# Patient Record
Sex: Female | Born: 1960 | Race: White | Hispanic: No | Marital: Married | Smoking: Former smoker
Health system: Southern US, Community
[De-identification: ages and names within clinical notes are randomized; demographics above are authoritative.]

---

## 1987-02-11 HISTORY — PX: TUBAL LIGATION: SHX77

## 2008-03-25 ENCOUNTER — Ambulatory Visit: Payer: Self-pay | Admitting: Occupational Medicine

## 2008-03-25 DIAGNOSIS — S63509A Unspecified sprain of unspecified wrist, initial encounter: Secondary | ICD-10-CM | POA: Insufficient documentation

## 2008-04-05 ENCOUNTER — Ambulatory Visit: Payer: Self-pay | Admitting: Family Medicine

## 2008-04-05 DIAGNOSIS — S60219A Contusion of unspecified wrist, initial encounter: Secondary | ICD-10-CM

## 2008-04-05 DIAGNOSIS — S60229A Contusion of unspecified hand, initial encounter: Secondary | ICD-10-CM | POA: Insufficient documentation

## 2009-05-25 IMAGING — CR DG WRIST 2V*L*
1 series · 1 of 1 positions shown · non-contrast
Comparison: 03/25/2008

CLINICAL DATA: Pain, fell 8-10 days ago

LEFT WRIST - 2 VIEW

[view not recorded]
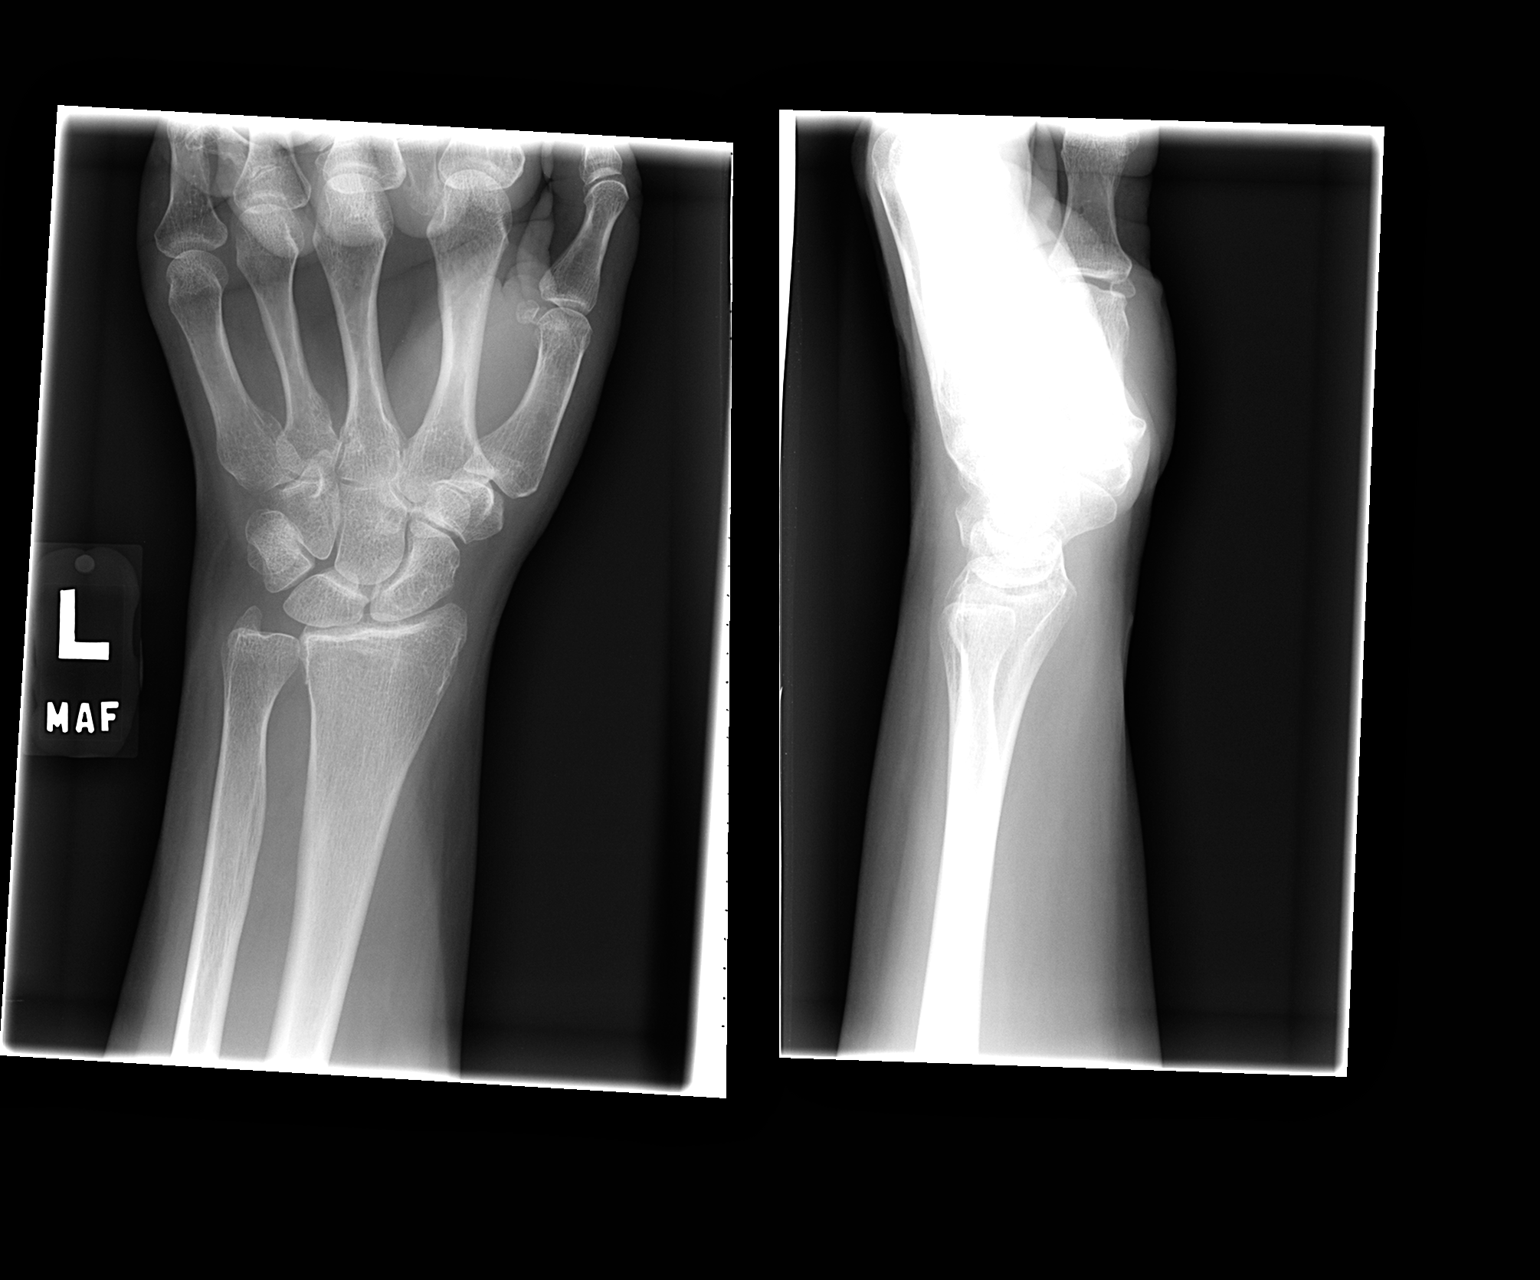

[1 of 1 positions shown; findings below may reference images not displayed]

FINDINGS: Mild bony demineralization.
No acute fracture, dislocation, or bone destruction.
Joint spaces preserved.
No definite soft tissue abnormalities.
IMPRESSION: No acute abnormalities.

## 2010-05-23 ENCOUNTER — Telehealth: Payer: Self-pay | Admitting: Family Medicine

## 2010-05-23 ENCOUNTER — Encounter: Payer: Self-pay | Admitting: Family Medicine

## 2010-05-23 ENCOUNTER — Ambulatory Visit
Admission: RE | Admit: 2010-05-23 | Discharge: 2010-05-23 | Disposition: A | Discharge status: Home / Self Care | Payer: 59 | Source: Ambulatory Visit | Attending: Family Medicine | Admitting: Family Medicine

## 2010-05-23 ENCOUNTER — Ambulatory Visit (INDEPENDENT_AMBULATORY_CARE_PROVIDER_SITE_OTHER): Payer: 59 | Admitting: Family Medicine

## 2010-05-23 VITALS — BP 133/88 | HR 108 | Ht 65.0 in | Wt 122.0 lb

## 2010-05-23 DIAGNOSIS — F419 Anxiety disorder, unspecified: Secondary | ICD-10-CM | POA: Insufficient documentation

## 2010-05-23 DIAGNOSIS — R0781 Pleurodynia: Secondary | ICD-10-CM

## 2010-05-23 DIAGNOSIS — F329 Major depressive disorder, single episode, unspecified: Secondary | ICD-10-CM

## 2010-05-23 DIAGNOSIS — F341 Dysthymic disorder: Secondary | ICD-10-CM

## 2010-05-23 DIAGNOSIS — R079 Chest pain, unspecified: Secondary | ICD-10-CM

## 2010-05-23 DIAGNOSIS — R634 Abnormal weight loss: Secondary | ICD-10-CM

## 2010-05-23 MED ORDER — CITALOPRAM HYDROBROMIDE 20 MG PO TABS: ORAL_TABLET | ORAL | Status: DC

## 2010-05-23 NOTE — Assessment & Plan Note (Addendum)
PHQ-9 score of 18, GAD-7 score of 17.  She clearly has depression and anxiety that is predominantly situational. We discussed the option of counseling I let her know we have 3 great counselors down stairs. They also discussed the option of medication including SSRIs. We discussed the benefits and potential risks with these medications including suicidal risks. She decided that she would like to start a medication. I will followup in 3 weeks. She says she will think about the counseling.

## 2010-05-23 NOTE — Telephone Encounter (Signed)
Call patient: No fracture of the ribs. Likely soft tissue injury should hopefully get better for the next 3-4 weeks.
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
## 2010-05-24 ENCOUNTER — Telehealth: Payer: Self-pay | Admitting: Family Medicine

## 2010-05-24 LAB — COMPLETE METABOLIC PANEL WITH GFR
AST: 14 U/L (ref 0–37)
Alkaline Phosphatase: 45 U/L (ref 39–117)
BUN: 9 mg/dL (ref 6–23)
Creat: 0.89 mg/dL (ref 0.40–1.20)
GFR, Est Non African American: >60 mL/min (ref >60)
Glucose, Bld: 90 mg/dL (ref 70–99)
Total Bilirubin: 0.4 mg/dL (ref 0.3–1.2)

## 2010-05-24 LAB — CBC
HCT: 45.8 % (ref 36.0–46.0)
Hemoglobin: 15.1 g/dL — ABNORMAL HIGH (ref 12.0–15.0)
MCH: 29.2 pg (ref 26.0–34.0)
MCHC: 33 g/dL (ref 30.0–36.0)
MCV: 88.6 fL (ref 78.0–100.0)
RDW: 15.2 % (ref 11.5–15.5)

## 2010-05-24 LAB — TSH: TSH: 1.23 u[IU]/mL (ref 0.350–4.500)

## 2010-05-24 NOTE — Telephone Encounter (Signed)
Call pt: Gina Macias, Gina Macias, Gina Macias. Hgb was elevated but that is usually from smoking. No anemia.

## 2010-05-24 NOTE — Telephone Encounter (Signed)
LMOM

## 2010-05-24 NOTE — Telephone Encounter (Signed)
Left results on vm. 

## 2010-06-20 ENCOUNTER — Ambulatory Visit (INDEPENDENT_AMBULATORY_CARE_PROVIDER_SITE_OTHER): Payer: 59 | Admitting: Family Medicine

## 2010-06-20 DIAGNOSIS — F341 Dysthymic disorder: Secondary | ICD-10-CM

## 2010-06-20 DIAGNOSIS — F329 Major depressive disorder, single episode, unspecified: Secondary | ICD-10-CM

## 2010-06-20 DIAGNOSIS — B079 Viral wart, unspecified: Secondary | ICD-10-CM

## 2010-06-20 DIAGNOSIS — G4700 Insomnia, unspecified: Secondary | ICD-10-CM

## 2010-06-20 MED ORDER — CITALOPRAM HYDROBROMIDE 20 MG PO TABS: 20.0000 mg | ORAL_TABLET | ORAL | Status: DC

## 2010-06-20 NOTE — Progress Notes (Signed)
  Subjective:    Patient ID: Gina Macias, female    DOB: March 20, 1960, 50 y.o.   MRN: [id number]  HPI Feels her mood is better but still not sleeping well. Only getting about 6 hours. Falling alseep Ok burt can't stay asleep. Goes to bed around 9:30-10:00 and usually wakes up 4 AM.  Sleeps is maybe some is better. Says has been doing well at work. No SE or sedation. No concerns.  She is happy with her current dose.    Review of Systems     Objective:   Physical Exam  Constitutional: She appears well-developed and well-nourished.  Cardiovascular: Normal rate, regular rhythm and normal heart sounds.   Pulmonary/Chest: Effort normal and breath sounds normal.  Psychiatric: She has a normal mood and affect. Her behavior is normal.          Assessment & Plan:  PHQ-9 score of 4 today.

## 2010-06-20 NOTE — Assessment & Plan Note (Signed)
PHQ- 9 score of 4 today which is at goal. Continue current dose and f/u in 6 weeks to see if doing even better and if sleep is improving.

## 2010-06-20 NOTE — Assessment & Plan Note (Signed)
Discussed that since her sleep is some better will continue her current regimen We did discuss the option of adding a sleep aid and she would like to hold off for now.

## 2010-06-21 NOTE — Progress Notes (Signed)
  Subjective:    Patient ID: Gina Macias, female    DOB: 1960/06/09, 50 y.o.   MRN: [id number]  HPI    Review of Systems     Objective:   Physical Exam       Also cryotherpay performed on the to of the right foot to a wart. This is her second freeze.  She does feel it looks some better.  Assessment & Plan:

## 2010-10-09 ENCOUNTER — Ambulatory Visit (INDEPENDENT_AMBULATORY_CARE_PROVIDER_SITE_OTHER): Payer: 59 | Admitting: Family Medicine

## 2010-10-09 VITALS — BP 116/72 | HR 76 | Wt 124.0 lb

## 2010-10-09 DIAGNOSIS — Z13220 Encounter for screening for lipoid disorders: Secondary | ICD-10-CM

## 2010-10-09 DIAGNOSIS — Z23 Encounter for immunization: Secondary | ICD-10-CM

## 2010-10-09 DIAGNOSIS — Z1211 Encounter for screening for malignant neoplasm of colon: Secondary | ICD-10-CM

## 2010-10-09 DIAGNOSIS — F329 Major depressive disorder, single episode, unspecified: Secondary | ICD-10-CM

## 2010-10-09 MED ORDER — CITALOPRAM HYDROBROMIDE 20 MG PO TABS: 20.0000 mg | ORAL_TABLET | ORAL | Status: DC

## 2010-10-09 NOTE — Progress Notes (Signed)
  Subjective:    Patient ID: Gina Macias, female    DOB: 1960-06-21, 50 y.o.   MRN: [id number]  HPI F/U depression. No problems or SE. Sleeping well.  She is has done really well. Has done some conseling. Work is really stressful but this has really been helping her.  Due for RF.      Review of Systems     Objective:   Physical Exam  Constitutional: She is oriented to person, place, and time. She appears well-developed and well-nourished.  HENT:  Head: Normocephalic and atraumatic.  Cardiovascular: Normal rate, regular rhythm and normal heart sounds.   Pulmonary/Chest: Effort normal and breath sounds normal.  Neurological: She is alert and oriented to person, place, and time.  Skin: Skin is warm and dry.  Psychiatric: She has a normal mood and affect.          Assessment & Plan:  Tdap given today. Flu vac declined.   Discussed need for colon cancer screening. Pt ok with referral to GI for colonoscopy.   Depression - Doing very well PHQ- 9 score of 0 today. Pt would like to continue med for now. I recommend being therapeutic fr 6-12 months to dec risk of relapse.  She has been on med for 4 months. F/U in Jan.   Insomnia - Resolved.

## 2010-12-03 ENCOUNTER — Encounter: Payer: Self-pay | Admitting: Family Medicine

## 2012-05-03 ENCOUNTER — Encounter: Payer: Self-pay | Admitting: Family Medicine

## 2015-11-13 ENCOUNTER — Ambulatory Visit (INDEPENDENT_AMBULATORY_CARE_PROVIDER_SITE_OTHER): Payer: BLUE CROSS/BLUE SHIELD | Admitting: Family Medicine

## 2015-11-13 ENCOUNTER — Encounter: Payer: Self-pay | Admitting: Family Medicine

## 2015-11-13 VITALS — BP 108/68 | HR 77 | Ht 65.0 in | Wt 184.0 lb

## 2015-11-13 DIAGNOSIS — M25472 Effusion, left ankle: Secondary | ICD-10-CM

## 2015-11-13 DIAGNOSIS — Z0000 Encounter for general adult medical examination without abnormal findings: Secondary | ICD-10-CM

## 2015-11-13 NOTE — Patient Instructions (Signed)
Keep up a regular exercise program and make sure you are eating a healthy diet Try to eat 4 servings of dairy a day, or if you are lactose intolerant take a calcium with vitamin D daily.  Your vaccines are up to date.   

## 2015-11-13 NOTE — Progress Notes (Signed)
Subjective:     Gina Macias is a 55 y.o. female and is here for a comprehensive physical exam. The patient reports no problems.  Social History   Social History  . Marital status: Married    Spouse name: Molly Maduro   . Number of children: 3  . 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 Smoking status: Former Smoker    Packs/day: 1.00    Types: Cigarettes    Quit date: 02/13/2015  . Smokeless tobacco: Never Used  . Alcohol use 0.6 - 1.2 oz/week    1 - 2 Cans of beer per week  . Drug use: No  . Sexual activity: Yes    Partners: Male    Birth control/ protection: None   Other Topics Concern  . Not on file   Social History Narrative   Her GYN is Dr. Unknown Foley at [facility]. She has 2 daughters that still live at home. One daughter is married and out of the home. She does exercise daily for about 20-30 minutes with walking and doing weights.   Health Maintenance  Topic Date Due  . Hepatitis C Screening  1961/02/03  . HIV Screening  07/01/1974  . INFLUENZA VACCINE  02/10/2016 (Originally 09/11/2015)  . MAMMOGRAM  02/10/2016 (Originally 04/09/2014)  . PAP SMEAR  02/10/2016 (Originally 03/30/2012)  . TETANUS/TDAP  10/08/2020  . COLONOSCOPY  11/27/2020    The following portions of the patient's history were reviewed and updated as appropriate: allergies, current medications, past family history, past medical history, past social history, past surgical history and problem list.  Review of Systems A comprehensive review of systems was negative.    BP 108/68   Pulse 77   Ht 5\' 5"  (1.651 m)   Wt 184 lb (83.5 kg)   SpO2 93%   BMI 30.62 kg/m     No Known Allergies  No past medical history on file.  Past Surgical History:  Procedure Laterality Date  . TUBAL LIGATION  1989    Social History   Social History  . Marital status: Married    Spouse name: Molly Maduro   . Number of  children: 3  . 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 Smoking status: Former Smoker    Packs/day: 1.00    Types: Cigarettes    Quit date: 02/13/2015  . Smokeless tobacco: Never Used  . Alcohol use 0.6 - 1.2 oz/week    1 - 2 Cans of beer per week  . Drug use: No  . Sexual activity: Yes    Partners: Male    Birth control/ protection: None   Other Topics Concern  . Not on file   Social History Narrative   Her GYN is Dr. Unknown Foley at [facility]. She has 2 daughters that still live at home. One daughter is married and out of the home. She does exercise daily for about 20-30 minutes with walking and doing weights.    Family History  Problem Relation Age of Onset  . Diabetes      cousin    Outpatient Encounter Prescriptions as of 11/13/2015  Medication Sig  . calcium carbonate 200 MG capsule Take 250 mg by mouth 2 (two) times daily with a meal.    . [DISCONTINUED] citalopram (CELEXA) 20 MG tablet Take 1 tablet (20  mg total) by mouth every morning.  . [DISCONTINUED] Multiple Vitamin (MULTIVITAMIN) tablet Take 1 tablet by mouth daily.    . [DISCONTINUED] Omega-3 Fatty Acids (FISH OIL) 1000 MG CAPS Take by mouth.     No facility-administered encounter medications on file as of 11/13/2015.        Objective:    BP 108/68   Pulse 77   Ht 5\' 5"  (1.651 m)   Wt 184 lb (83.5 kg)   SpO2 93%   BMI 30.62 kg/m  General appearance: alert, cooperative and appears stated age Head: Normocephalic, without obvious abnormality, atraumatic Eyes: conj clear, EOMI< PEERLA Ears: normal TM's and external ear canals both ears Nose: Nares normal. Septum midline. Mucosa normal. No drainage or sinus tenderness. Throat: lips, mucosa, and tongue normal; teeth and gums normal Neck: no adenopathy, no carotid bruit, no JVD, supple, symmetrical, trachea midline and thyroid not enlarged, symmetric, no  tenderness/mass/nodules Back: symmetric, no curvature. ROM normal. No CVA tenderness. Lungs: clear to auscultation bilaterally Heart: regular rate and rhythm, S1, S2 normal, no murmur, click, rub or gallop Abdomen: soft, non-tender; bowel sounds normal; no masses,  no organomegaly Extremities: trace edema right ankle. right ankle is normal Pulses: 2+ and symmetric Skin: Skin color, texture, turgor normal. No rashes or lesions Lymph nodes: Cervical, supraclavicular, and axillary nodes normal. Neurologic: Alert and oriented X 3, normal strength and tone. Normal symmetric reflexes. Normal coordination and gait    Assessment:    Healthy female exam.    Plan:     See After Visit Summary for Counseling Recommendations   Keep up a regular exercise program and make sure you are eating a healthy diet Try to eat 4 servings of dairy a day, or if you are lactose intolerant take a calcium with vitamin D daily.  Your vaccines are up to date.   Left ankle edema, trace-could just be from venous stasis. Will check thyroid and CBC as well as a regular blood work I gave reassurance. Certainly the gets worse please let me know. Could consider wearing compression stockings.

## 2015-11-20 LAB — CBC WITH DIFFERENTIAL/PLATELET
BASOS PCT: 0 %
Basophils Absolute: 0 cells/uL (ref 0–200)
EOS ABS: 284 {cells}/uL (ref 15–500)
Eosinophils Relative: 4 %
HCT: 41.6 % (ref 35.0–45.0)
Hemoglobin: 13.7 g/dL (ref 11.7–15.5)
LYMPHS PCT: 26 %
Lymphs Abs: 1846 cells/uL (ref 850–3900)
MCH: 28.3 pg (ref 27.0–33.0)
MCHC: 32.9 g/dL (ref 32.0–36.0)
MCV: 86 fL (ref 80.0–100.0)
MONOS PCT: 7 %
MPV: 9.6 fL (ref 7.5–12.5)
Monocytes Absolute: 497 cells/uL (ref 200–950)
NEUTROS PCT: 63 %
Neutro Abs: 4473 cells/uL (ref 1500–7800)
PLATELETS: 377 10*3/uL (ref 140–400)
RBC: 4.84 MIL/uL (ref 3.80–5.10)
RDW: 14.9 % (ref 11.0–15.0)
WBC: 7.1 10*3/uL (ref 3.8–10.8)

## 2015-11-21 LAB — LIPID PANEL
Cholesterol: 260 mg/dL — ABNORMAL HIGH (ref 125–200)
HDL: 48 mg/dL (ref >=46)
LDL Cholesterol: 179 mg/dL — ABNORMAL HIGH (ref <130)
Total CHOL/HDL Ratio: 5.4 Ratio — ABNORMAL HIGH (ref <=5.0)
Triglycerides: 165 mg/dL — ABNORMAL HIGH (ref <150)
VLDL: 33 mg/dL — ABNORMAL HIGH (ref <30)

## 2015-11-21 LAB — COMPLETE METABOLIC PANEL WITH GFR
ALT: 25 U/L (ref 6–29)
AST: 23 U/L (ref 10–35)
Albumin: 4.3 g/dL (ref 3.6–5.1)
Alkaline Phosphatase: 79 U/L (ref 33–130)
BUN: 12 mg/dL (ref 7–25)
CO2: 27 mmol/L (ref 20–31)
Calcium: 9.5 mg/dL (ref 8.6–10.4)
Chloride: 103 mmol/L (ref 98–110)
Creat: 0.85 mg/dL (ref 0.50–1.05)
GFR, EST NON AFRICAN AMERICAN: 77 mL/min (ref >=60)
GFR, Est African American: 89 mL/min (ref >=60)
GLUCOSE: 104 mg/dL — AB (ref 65–99)
POTASSIUM: 4.5 mmol/L (ref 3.5–5.3)
SODIUM: 138 mmol/L (ref 135–146)
TOTAL PROTEIN: 6.2 g/dL (ref 6.1–8.1)
Total Bilirubin: 0.5 mg/dL (ref 0.2–1.2)

## 2015-11-21 LAB — TSH: TSH: 1.59 mIU/L

## 2015-11-26 LAB — HM MAMMOGRAPHY

## 2015-12-06 ENCOUNTER — Encounter: Payer: Self-pay | Admitting: Family Medicine

## 2017-03-27 DIAGNOSIS — Z1231 Encounter for screening mammogram for malignant neoplasm of breast: Secondary | ICD-10-CM | POA: Diagnosis not present

## 2017-03-27 LAB — HM MAMMOGRAPHY

## 2017-04-03 ENCOUNTER — Encounter: Payer: Self-pay | Admitting: Family Medicine

## 2017-07-22 DIAGNOSIS — H04123 Dry eye syndrome of bilateral lacrimal glands: Secondary | ICD-10-CM | POA: Diagnosis not present

## 2018-03-29 DIAGNOSIS — Z1231 Encounter for screening mammogram for malignant neoplasm of breast: Secondary | ICD-10-CM | POA: Diagnosis not present

## 2018-03-29 LAB — HM MAMMOGRAPHY

## 2018-04-05 DIAGNOSIS — Z6831 Body mass index (BMI) 31.0-31.9, adult: Secondary | ICD-10-CM | POA: Diagnosis not present

## 2018-04-05 DIAGNOSIS — Z01419 Encounter for gynecological examination (general) (routine) without abnormal findings: Secondary | ICD-10-CM | POA: Diagnosis not present

## 2018-10-13 ENCOUNTER — Ambulatory Visit (INDEPENDENT_AMBULATORY_CARE_PROVIDER_SITE_OTHER): Payer: BC Managed Care – PPO | Admitting: Family Medicine

## 2018-10-13 ENCOUNTER — Other Ambulatory Visit: Payer: Self-pay

## 2018-10-13 ENCOUNTER — Other Ambulatory Visit: Payer: Self-pay | Admitting: *Deleted

## 2018-10-13 ENCOUNTER — Encounter: Payer: Self-pay | Admitting: Family Medicine

## 2018-10-13 VITALS — BP 131/69 | HR 75 | Ht 65.0 in | Wt 193.0 lb

## 2018-10-13 DIAGNOSIS — Z23 Encounter for immunization: Secondary | ICD-10-CM

## 2018-10-13 DIAGNOSIS — E782 Mixed hyperlipidemia: Secondary | ICD-10-CM | POA: Diagnosis not present

## 2018-10-13 DIAGNOSIS — Z0000 Encounter for general adult medical examination without abnormal findings: Secondary | ICD-10-CM

## 2018-10-13 DIAGNOSIS — E785 Hyperlipidemia, unspecified: Secondary | ICD-10-CM | POA: Diagnosis not present

## 2018-10-13 NOTE — Patient Instructions (Signed)

## 2018-10-13 NOTE — Progress Notes (Signed)
Pt was seen @ OB in February for her annual exam and Ronette Deter, Lahoma Crocker, CMA

## 2018-10-13 NOTE — Progress Notes (Signed)
Subjective:     Gina LemonsSheri L Macias is a 58 y.o. female and is here for a comprehensive physical exam. The patient reports no problems.  He is doing well overall.  Last time she was here about 3 years ago she has been keeping up with her regular GYN appointments and says her mammogram is actually up-to-date she had a June either February or March of this year.  She admits she has not been exercising and has gained some weight but plans on getting back on track.  Her colonoscopy is up-to-date.  She did want to discuss the shingles vaccine today.  Tetanus is up-to-date.  Social History   Socioeconomic History  . Marital status: Married    Spouse name: Gina MaduroRobert   . Number of children: 3  . Years of education: College  . Highest education level: Not on file  Occupational History  . Occupation: Agricultural engineeraccountant    Employer: [employer]  Social Needs  . Financial resource strain: Not on file  . Food insecurity    Worry: Not on file    Inability: Not on file  . Transportation needs    Medical: Not on file    Non-medical: Not on file  Tobacco Use  . Smoking status: Former Smoker    Packs/day: 1.00    Types: Cigarettes    Quit date: 02/13/2015    Years since quitting: 3.6  . Smokeless tobacco: Never Used  Substance and Sexual Activity  . Alcohol use: Yes    Alcohol/week: 1.0 - 2.0 standard drinks    Types: 1 - 2 Cans of beer per week  . Drug use: No  . Sexual activity: Yes    Partners: Male    Birth control/protection: None  Lifestyle  . Physical activity    Days per week: Not on file    Minutes per session: Not on file  . Stress: Not on file  Relationships  . Social Musicianconnections    Talks on phone: Not on file    Gets together: Not on file    Attends religious service: Not on file    Active member of club or organization: Not on file    Attends meetings of clubs or organizations: Not on file    Relationship status: Not on file  . Intimate partner violence    Fear of current or ex  partner: Not on file    Emotionally abused: Not on file    Physically abused: Not on file    Forced sexual activity: Not on file  Other Topics Concern  . Not on file  Social History Narrative   [facility]. She has 2 daughters that still live at home. One daughter is married and out of the home. She does exercise daily for about 20-30 minutes with walking and doing weights. 2 caffeinated drinks daily.   Health Maintenance  Topic Date Due  . INFLUENZA VACCINE  05/11/2019 (Originally 09/11/2018)  . Hepatitis C Screening  10/13/2019 (Originally 1961/01/23)  . HIV Screening  10/13/2019 (Originally 07/01/1974)  . MAMMOGRAM  03/28/2019  . PAP SMEAR-Modifier  03/16/2020  . TETANUS/TDAP  10/08/2020  . COLONOSCOPY  11/27/2020    The following portions of the patient's history were reviewed and updated as appropriate: allergies, current medications, past family history, past medical history, past social history, past surgical history and problem list.  Review of Systems A comprehensive review of systems was negative.   Objective:  BP 131/69   Pulse 75   Ht 5\' 5"  (1.651 m)   Wt 193 lb (87.5 kg)   SpO2 99%   BMI 32.12 kg/m  General appearance: alert, cooperative and appears stated age Head: Normocephalic, without obvious abnormality, atraumatic Eyes: conj clear, EOMI, PEERLA Ears: normal TM's and external ear canals both ears Nose: Nares normal. Septum midline. Mucosa normal. No drainage or sinus tenderness. Throat: lips, mucosa, and tongue normal; teeth and gums normal Neck: no adenopathy, no carotid bruit, no JVD, supple, symmetrical, trachea midline and thyroid not enlarged, symmetric, no tenderness/mass/nodules Back: symmetric, no curvature. ROM normal. No CVA tenderness. Lungs: clear to auscultation bilaterally Heart: regular rate and rhythm, S1, S2 normal, no murmur, click, rub or gallop Abdomen: soft, non-tender; bowel sounds  normal; no masses,  no organomegaly Extremities: extremities normal, atraumatic, no cyanosis or edema Pulses: 2+ and symmetric Skin: Skin color, texture, turgor normal. No rashes or lesions or She has a few seborrheic keratosis 1 particularly in the right axillary area that she wants me to look at today.  She has several small cherry angiomas. Lymph nodes: Cervical adenopathy: nl and Supraclavicular adenopathy: nl Neurologic: Alert and oriented X 3, normal strength and tone. Normal symmetric reflexes. Normal coordination and gait    Assessment:    Healthy female exam.      Plan:     See After Visit Summary for Counseling Recommendations   Keep up a regular exercise program and make sure you are eating a healthy diet.  Starting an exercise program starting with even just 1 day a week of exercise and then building on that. Try to eat 4 servings of dairy a day, or if you are lactose intolerant take a calcium with vitamin D daily.  Your vaccines are up to date.  Due for labs.   Given first shingles vaccine today.  Will call for mammo report from her gyn.    Seborrheic keratosis on the right side just below the axilla-discussed that we can schedule her for cryotherapy when she would like.  She would like to do it when she is due for her next shingles vaccine.

## 2018-10-14 LAB — CBC
HCT: 41.6 % (ref 35.0–45.0)
Hemoglobin: 13.2 g/dL (ref 11.7–15.5)
MCH: 26.6 pg — ABNORMAL LOW (ref 27.0–33.0)
MCHC: 31.7 g/dL — ABNORMAL LOW (ref 32.0–36.0)
MCV: 83.9 fL (ref 80.0–100.0)
MPV: 9.4 fL (ref 7.5–12.5)
Platelets: 368 10*3/uL (ref 140–400)
RBC: 4.96 10*6/uL (ref 3.80–5.10)
RDW: 14.5 % (ref 11.0–15.0)
WBC: 9.4 10*3/uL (ref 3.8–10.8)

## 2018-10-14 LAB — COMPLETE METABOLIC PANEL WITH GFR
AG Ratio: 2.2 (calc) (ref 1.0–2.5)
ALT: 19 U/L (ref 6–29)
AST: 16 U/L (ref 10–35)
Albumin: 4.4 g/dL (ref 3.6–5.1)
Alkaline phosphatase (APISO): 66 U/L (ref 37–153)
BUN: 10 mg/dL (ref 7–25)
CO2: 28 mmol/L (ref 20–32)
Calcium: 9 mg/dL (ref 8.6–10.4)
Chloride: 106 mmol/L (ref 98–110)
Creat: 0.83 mg/dL (ref 0.50–1.05)
GFR, Est African American: 89 mL/min/{1.73_m2} (ref >=60)
GFR, Est Non African American: 77 mL/min/{1.73_m2} (ref >=60)
Globulin: 2 g/dL (calc) (ref 1.9–3.7)
Glucose, Bld: 97 mg/dL (ref 65–99)
Potassium: 4.4 mmol/L (ref 3.5–5.3)
Sodium: 141 mmol/L (ref 135–146)
Total Bilirubin: 0.4 mg/dL (ref 0.2–1.2)
Total Protein: 6.4 g/dL (ref 6.1–8.1)

## 2018-10-14 LAB — LIPID PANEL
Cholesterol: 264 mg/dL — ABNORMAL HIGH (ref <200)
HDL: 48 mg/dL — ABNORMAL LOW (ref >=50)
LDL Cholesterol (Calc): 186 mg/dL (calc) — ABNORMAL HIGH
Non-HDL Cholesterol (Calc): 216 mg/dL (calc) — ABNORMAL HIGH (ref <130)
Total CHOL/HDL Ratio: 5.5 (calc) — ABNORMAL HIGH (ref <5.0)
Triglycerides: 153 mg/dL — ABNORMAL HIGH (ref <150)

## 2018-10-31 DIAGNOSIS — M79602 Pain in left arm: Secondary | ICD-10-CM | POA: Diagnosis not present

## 2018-10-31 DIAGNOSIS — S42352A Displaced comminuted fracture of shaft of humerus, left arm, initial encounter for closed fracture: Secondary | ICD-10-CM | POA: Diagnosis not present

## 2018-10-31 DIAGNOSIS — R000 Tachycardia, unspecified: Secondary | ICD-10-CM | POA: Diagnosis not present

## 2018-10-31 DIAGNOSIS — Z87891 Personal history of nicotine dependence: Secondary | ICD-10-CM | POA: Diagnosis not present

## 2018-10-31 DIAGNOSIS — S42292A Other displaced fracture of upper end of left humerus, initial encounter for closed fracture: Secondary | ICD-10-CM | POA: Diagnosis not present

## 2018-10-31 DIAGNOSIS — W19XXXA Unspecified fall, initial encounter: Secondary | ICD-10-CM | POA: Diagnosis not present

## 2018-10-31 DIAGNOSIS — G8911 Acute pain due to trauma: Secondary | ICD-10-CM | POA: Diagnosis not present

## 2018-11-02 DIAGNOSIS — S42295A Other nondisplaced fracture of upper end of left humerus, initial encounter for closed fracture: Secondary | ICD-10-CM | POA: Diagnosis not present

## 2018-11-05 DIAGNOSIS — S42202A Unspecified fracture of upper end of left humerus, initial encounter for closed fracture: Secondary | ICD-10-CM | POA: Insufficient documentation

## 2018-11-09 DIAGNOSIS — R52 Pain, unspecified: Secondary | ICD-10-CM | POA: Diagnosis not present

## 2018-11-09 DIAGNOSIS — S42295A Other nondisplaced fracture of upper end of left humerus, initial encounter for closed fracture: Secondary | ICD-10-CM | POA: Diagnosis not present

## 2018-12-01 DIAGNOSIS — S42295A Other nondisplaced fracture of upper end of left humerus, initial encounter for closed fracture: Secondary | ICD-10-CM | POA: Diagnosis not present

## 2018-12-01 DIAGNOSIS — R52 Pain, unspecified: Secondary | ICD-10-CM | POA: Diagnosis not present

## 2018-12-15 DIAGNOSIS — R29898 Other symptoms and signs involving the musculoskeletal system: Secondary | ICD-10-CM | POA: Diagnosis not present

## 2018-12-15 DIAGNOSIS — S42295D Other nondisplaced fracture of upper end of left humerus, subsequent encounter for fracture with routine healing: Secondary | ICD-10-CM | POA: Diagnosis not present

## 2018-12-15 DIAGNOSIS — Z4789 Encounter for other orthopedic aftercare: Secondary | ICD-10-CM | POA: Diagnosis not present

## 2018-12-15 DIAGNOSIS — M25512 Pain in left shoulder: Secondary | ICD-10-CM | POA: Diagnosis not present

## 2018-12-23 ENCOUNTER — Encounter: Payer: Self-pay | Admitting: Family Medicine

## 2018-12-29 DIAGNOSIS — R29898 Other symptoms and signs involving the musculoskeletal system: Secondary | ICD-10-CM | POA: Diagnosis not present

## 2018-12-29 DIAGNOSIS — M25512 Pain in left shoulder: Secondary | ICD-10-CM | POA: Diagnosis not present

## 2018-12-29 DIAGNOSIS — S42295A Other nondisplaced fracture of upper end of left humerus, initial encounter for closed fracture: Secondary | ICD-10-CM | POA: Diagnosis not present

## 2018-12-29 DIAGNOSIS — S42295D Other nondisplaced fracture of upper end of left humerus, subsequent encounter for fracture with routine healing: Secondary | ICD-10-CM | POA: Diagnosis not present

## 2018-12-29 DIAGNOSIS — Z4789 Encounter for other orthopedic aftercare: Secondary | ICD-10-CM | POA: Diagnosis not present

## 2019-01-10 DIAGNOSIS — M25512 Pain in left shoulder: Secondary | ICD-10-CM | POA: Diagnosis not present

## 2019-01-10 DIAGNOSIS — Z4789 Encounter for other orthopedic aftercare: Secondary | ICD-10-CM | POA: Diagnosis not present

## 2019-01-10 DIAGNOSIS — S42295D Other nondisplaced fracture of upper end of left humerus, subsequent encounter for fracture with routine healing: Secondary | ICD-10-CM | POA: Diagnosis not present

## 2019-01-10 DIAGNOSIS — R29898 Other symptoms and signs involving the musculoskeletal system: Secondary | ICD-10-CM | POA: Diagnosis not present

## 2019-01-13 ENCOUNTER — Ambulatory Visit: Payer: BC Managed Care – PPO | Admitting: Family Medicine

## 2019-01-13 DIAGNOSIS — S42295D Other nondisplaced fracture of upper end of left humerus, subsequent encounter for fracture with routine healing: Secondary | ICD-10-CM | POA: Diagnosis not present

## 2019-01-17 DIAGNOSIS — S42295D Other nondisplaced fracture of upper end of left humerus, subsequent encounter for fracture with routine healing: Secondary | ICD-10-CM | POA: Diagnosis not present

## 2019-01-20 DIAGNOSIS — S42295D Other nondisplaced fracture of upper end of left humerus, subsequent encounter for fracture with routine healing: Secondary | ICD-10-CM | POA: Diagnosis not present

## 2019-01-26 ENCOUNTER — Ambulatory Visit: Payer: BC Managed Care – PPO | Admitting: Family Medicine

## 2019-02-07 DIAGNOSIS — S42295D Other nondisplaced fracture of upper end of left humerus, subsequent encounter for fracture with routine healing: Secondary | ICD-10-CM | POA: Diagnosis not present

## 2019-02-08 ENCOUNTER — Encounter: Payer: Self-pay | Admitting: Family Medicine

## 2019-02-08 ENCOUNTER — Ambulatory Visit: Payer: BC Managed Care – PPO | Admitting: Family Medicine

## 2019-02-08 ENCOUNTER — Other Ambulatory Visit: Payer: Self-pay | Admitting: Family Medicine

## 2019-02-08 ENCOUNTER — Other Ambulatory Visit: Payer: Self-pay

## 2019-02-08 VITALS — BP 122/75 | HR 87 | Ht 65.0 in | Wt 184.0 lb

## 2019-02-08 DIAGNOSIS — L821 Other seborrheic keratosis: Secondary | ICD-10-CM

## 2019-02-08 DIAGNOSIS — L989 Disorder of the skin and subcutaneous tissue, unspecified: Secondary | ICD-10-CM

## 2019-02-08 DIAGNOSIS — Z23 Encounter for immunization: Secondary | ICD-10-CM

## 2019-02-08 DIAGNOSIS — D2339 Other benign neoplasm of skin of other parts of face: Secondary | ICD-10-CM | POA: Diagnosis not present

## 2019-02-08 NOTE — Progress Notes (Signed)
Acute Office Visit  Subjective:    Patient ID: Gina Macias, female    DOB: Nov 27, 1960, 58 y.o.   MRN: [id number]  Chief Complaint  Patient presents with  . seborrheic keratosis    HPI Patient is in today for seborrheic keratosis treatment with cryotherapy.  She has some on her right side below the axilla as well as on her face underneath her.  Upon checking her skin I noticed a lesion on her right temple.  She says it started out as a blackhead almost 30 years ago and that the lesion has been there for almost 30 years.  Usually covers it up with her hair.  It has not really changed or gotten worse recently.  Also here for second shingles vaccine.  History reviewed. No pertinent past medical history.  Past Surgical History:  Procedure Laterality Date  . TUBAL LIGATION  1989    Family History  Problem Relation Age of Onset  . Diabetes Unknown        cousin    Social History   Socioeconomic History  . Marital status: Married    Spouse name: Herbie Baltimore   . Number of children: 3  . Years of education: College  . Highest education level: Not on file  Occupational History  . Occupation: Aeronautical engineer: [employer]  Tobacco Use  . Smoking status: Former Smoker    Packs/day: 1.00    Types: Cigarettes    Quit date: 02/13/2015    Years since quitting: 3.9  . Smokeless tobacco: Never Used  Substance and Sexual Activity  . Alcohol use: Yes    Alcohol/week: 1.0 - 2.0 standard drinks    Types: 1 - 2 Cans of beer per week  . Drug use: No  . Sexual activity: Yes    Partners: Male    Birth control/protection: None  Other Topics Concern  . Not on file  Social History Narrative   Her GYN is Dr. Algie Coffer at [facility] woman care. She has 2 daughters that still live at home. One daughter is married and out of the home. She does exercise daily for about 20-30 minutes with walking and doing weights. 2 caffeinated drinks daily.   Social Determinants of  Health   Financial Resource Strain:   . Difficulty of Paying Living Expenses: Not on file  Food Insecurity:   . Worried About Charity fundraiser in the Last Year: Not on file  . Ran Out of Food in the Last Year: Not on file  Transportation Needs:   . Lack of Transportation (Medical): Not on file  . Lack of Transportation (Non-Medical): Not on file  Physical Activity:   . Days of Exercise per Week: Not on file  . Minutes of Exercise per Session: Not on file  Stress:   . Feeling of Stress : Not on file  Social Connections:   . Frequency of Communication with Friends and Family: Not on file  . Frequency of Social Gatherings with Friends and Family: Not on file  . Attends Religious Services: Not on file  . Active Member of Clubs or Organizations: Not on file  . Attends Archivist Meetings: Not on file  . Marital Status: Not on file  Intimate Partner Violence:   . Fear of Current or Ex-Partner: Not on file  . Emotionally Abused: Not on file  . Physically Abused: Not on file  . Sexually Abused: Not on file    Outpatient Medications  Prior to Visit  Medication Sig Dispense Refill  . calcium carbonate 200 MG capsule Take 250 mg by mouth 2 (two) times daily with a meal.      . Omega-3 Fatty Acids (FISH OIL) 1000 MG CAPS Take by mouth.     No facility-administered medications prior to visit.    No Known Allergies  Review of Systems     Objective:    Physical Exam Vitals reviewed.  Constitutional:      Appearance: She is well-developed.  HENT:     Head: Normocephalic and atraumatic.  Eyes:     Conjunctiva/sclera: Conjunctivae normal.  Cardiovascular:     Rate and Rhythm: Normal rate.  Pulmonary:     Effort: Pulmonary effort is normal.  Skin:    General: Skin is dry.     Coloration: Skin is not pale.     Comments: She has a larger thick brown seborrheic keratosis on her right side below the axilla.  She had a several oval-shaped light brown seborrheic  keratoses on her mid to low back.  And a few smaller ones underneath her right and left eye.  Also on her right temple she has a papular shiny pink lesion just a little larger than a centimeter.  Neurological:     Mental Status: She is alert and oriented to person, place, and time.  Psychiatric:        Behavior: Behavior normal.     BP 122/75   Pulse 87   Ht 5\' 5"  (1.651 m)   Wt 184 lb (83.5 kg)   SpO2 97%   BMI 30.62 kg/m  Wt Readings from Last 3 Encounters:  02/08/19 184 lb (83.5 kg)  10/13/18 193 lb (87.5 kg)  11/13/15 184 lb (83.5 kg)    There are no preventive care reminders to display for this patient.  There are no preventive care reminders to display for this patient.   Lab Results  Component Value Date   TSH 1.59 11/20/2015   Lab Results  Component Value Date   WBC 9.4 10/13/2018   HGB 13.2 10/13/2018   HCT 41.6 10/13/2018   MCV 83.9 10/13/2018   PLT 368 10/13/2018   Lab Results  Component Value Date   NA 141 10/13/2018   K 4.4 10/13/2018   CO2 28 10/13/2018   GLUCOSE 97 10/13/2018   BUN 10 10/13/2018   CREATININE 0.83 10/13/2018   BILITOT 0.4 10/13/2018   ALKPHOS 79 11/20/2015   AST 16 10/13/2018   ALT 19 10/13/2018   PROT 6.4 10/13/2018   ALBUMIN 4.3 11/20/2015   CALCIUM 9.0 10/13/2018   Lab Results  Component Value Date   CHOL 264 (H) 10/13/2018   Lab Results  Component Value Date   HDL 48 (L) 10/13/2018   Lab Results  Component Value Date   LDLCALC 186 (H) 10/13/2018   Lab Results  Component Value Date   TRIG 153 (H) 10/13/2018   Lab Results  Component Value Date   CHOLHDL 5.5 (H) 10/13/2018   No results found for: HGBA1C     Assessment & Plan:   Problem List Items Addressed This Visit    None    Visit Diagnoses    Need for shingles vaccine    -  Primary   Relevant Orders   Varicella-zoster vaccine IM (Shingrix) (Completed)   Skin lesion       Seborrheic keratoses         Seborrheic keratoses-cryotherapy performed.   Patient tolerated  well.  Skin lesion on right temple-even of the lesion has been there for about 30 years it really had a distinct look similar to basal cell carcinoma and I recommended shave biopsy for further evaluation.  Cryotherapy Procedure Note  Pre-operative Diagnosis: Seborrheic keratoses on her back and on her right side  Post-operative Diagnosis: same  Locations: back and right side.    Indications: irritation  Anesthesia: not required    Procedure Details  Patient informed of risks (permanent scarring, infection, light or dark discoloration, bleeding, infection, weakness, numbness and recurrence of the lesion) and benefits of the procedure and verbal informed consent obtained.  The areas are treated with liquid nitrogen therapy, frozen until ice ball extended 1-2 mm beyond lesion, allowed to thaw, and treated again. The patient tolerated procedure well.  The patient was instructed on post-op care, warned that there may be blister formation, redness and pain. Recommend OTC analgesia as needed for pain.  Condition: Stable  Complications: none.  Plan: 1. Instructed to keep the area dry and covered for 24-48h and clean thereafter. 2. Warning signs of infection were reviewed.   3. Recommended that the patient use OTC acetaminophen as needed for pain.  4. Return PRN.    No orders of the defined types were placed in this encounter.    Shave Biopsy Procedure Note  Pre-operative Diagnosis: Suspicious lesion  Post-operative Diagnosis: same  Locations:right temples  Indications: shiney pearly appearance worrisome for basal cell skin cancer   Anesthesia: not required    Procedure Details  Patient informed of the risks (including bleeding and infection) and benefits of the  procedure and Verbal informed consent obtained.  The lesion and surrounding area were given a sterile prep using chlorhexidine and draped in the usual sterile fashion. A scalpel was used to  shave an area of skin approximately 1.5cm by 1.2cm.  Hemostasis achieved with alumuninum chloride. Antibiotic ointment and a sterile dressing applied.  The specimen was sent for pathologic examination. The patient tolerated the procedure well.  EBL: trace   Findings: Await pathology  Condition: Stable  Complications: none.  Plan: 1. Instructed to keep the wound dry and covered for 24-48h and clean thereafter. 2. Warning signs of infection were reviewed.   3. Recommended that the patient use OTC acetaminophen as needed for pain.  4. Return PRN.    Nani Gasseratherine Malaia Buchta, MD

## 2019-02-08 NOTE — Addendum Note (Signed)
Addended by: Teddy Spike on: 02/08/2019 04:49 PM   Modules accepted: Orders

## 2019-02-08 NOTE — Patient Instructions (Signed)
Keep wound covered until tomorrow morning. OK to get wet in the shower. Pat dry and immediately apply Vaseline twice a day for 2 weeks. Don't have to keep covered after tomorrow.    Call if any concerns with wound healing.  Your reports should be back within the week.

## 2019-02-09 DIAGNOSIS — S42295D Other nondisplaced fracture of upper end of left humerus, subsequent encounter for fracture with routine healing: Secondary | ICD-10-CM | POA: Diagnosis not present

## 2019-02-16 DIAGNOSIS — R29898 Other symptoms and signs involving the musculoskeletal system: Secondary | ICD-10-CM | POA: Diagnosis not present

## 2019-02-16 DIAGNOSIS — S42295D Other nondisplaced fracture of upper end of left humerus, subsequent encounter for fracture with routine healing: Secondary | ICD-10-CM | POA: Diagnosis not present

## 2019-02-16 DIAGNOSIS — M25512 Pain in left shoulder: Secondary | ICD-10-CM | POA: Diagnosis not present

## 2019-02-19 DIAGNOSIS — Z20822 Contact with and (suspected) exposure to covid-19: Secondary | ICD-10-CM | POA: Diagnosis not present

## 2019-04-29 DIAGNOSIS — Z1231 Encounter for screening mammogram for malignant neoplasm of breast: Secondary | ICD-10-CM | POA: Diagnosis not present

## 2019-04-29 LAB — HM MAMMOGRAPHY

## 2019-05-04 ENCOUNTER — Encounter: Payer: Self-pay | Admitting: Family Medicine

## 2019-06-15 DIAGNOSIS — H04123 Dry eye syndrome of bilateral lacrimal glands: Secondary | ICD-10-CM | POA: Diagnosis not present

## 2019-10-06 ENCOUNTER — Other Ambulatory Visit: Payer: Self-pay

## 2019-10-06 ENCOUNTER — Ambulatory Visit: Payer: BC Managed Care – PPO | Admitting: Family Medicine

## 2019-10-06 ENCOUNTER — Encounter: Payer: Self-pay | Admitting: Family Medicine

## 2019-10-06 VITALS — BP 130/77 | HR 82 | Ht 65.0 in | Wt 185.0 lb

## 2019-10-06 DIAGNOSIS — L918 Other hypertrophic disorders of the skin: Secondary | ICD-10-CM | POA: Diagnosis not present

## 2019-10-06 NOTE — Progress Notes (Signed)
Established Patient Office Visit  Subjective:  Patient ID: Gina Macias, female    DOB: 02/18/1960  Age: 59 y.o. MRN: [id number]  CC: No chief complaint on file.   HPI Gina Macias presents for several skin tags that she would like to have frozen today since they are fairly small she has one behind her right knee she has about 6 near the right axilla and to the lateral side of the right breast and 2 on the right side of her neck.  He has had similar lesions treated before.  History reviewed. No pertinent past medical history.  Past Surgical History:  Procedure Laterality Date  . TUBAL LIGATION  1989    Family History  Problem Relation Age of Onset  . Diabetes Unknown        cousin    Social History   Socioeconomic History  . Marital status: Married    Spouse name: Molly Maduro   . Number of children: 3  . Years of education: College  . Highest education level: Not on file  Occupational History  . Occupation: Agricultural engineer: [employer]  Tobacco Use  . Smoking status: Former Smoker    Packs/day: 1.00    Types: Cigarettes    Quit date: 02/13/2015    Years since quitting: 4.6  . Smokeless tobacco: Never Used  Substance and Sexual Activity  . Alcohol use: Yes    Alcohol/week: 1.0 - 2.0 standard drink    Types: 1 - 2 Cans of beer per week  . Drug use: No  . Sexual activity: Yes    Partners: Male    Birth control/protection: None  Other Topics Concern  . Not on file  Social History Narrative   Her GYN is Dr. Unknown Foley at [facility] woman care. She has 2 daughters that still live at home. One daughter is married and out of the home. She does exercise daily for about 20-30 minutes with walking and doing weights. 2 caffeinated drinks daily.   Social Determinants of Health   Financial Resource Strain:   . Difficulty of Paying Living Expenses: Not on file  Food Insecurity:   . Worried About Programme researcher, broadcasting/film/video in the Last Year: Not on file  .  Ran Out of Food in the Last Year: Not on file  Transportation Needs:   . Lack of Transportation (Medical): Not on file  . Lack of Transportation (Non-Medical): Not on file  Physical Activity:   . Days of Exercise per Week: Not on file  . Minutes of Exercise per Session: Not on file  Stress:   . Feeling of Stress : Not on file  Social Connections:   . Frequency of Communication with Friends and Family: Not on file  . Frequency of Social Gatherings with Friends and Family: Not on file  . Attends Religious Services: Not on file  . Active Member of Clubs or Organizations: Not on file  . Attends Banker Meetings: Not on file  . Marital Status: Not on file  Intimate Partner Violence:   . Fear of Current or Ex-Partner: Not on file  . Emotionally Abused: Not on file  . Physically Abused: Not on file  . Sexually Abused: Not on file    Outpatient Medications Prior to Visit  Medication Sig Dispense Refill  . ALREX 0.2 % SUSP Apply 1 drop to eye daily.    Marland Kitchen XIIDRA 5 % SOLN INSTILL 1 DROP INTO BOTH  EYES TWICE A DAY    . calcium carbonate 200 MG capsule Take 250 mg by mouth 2 (two) times daily with a meal.      . Omega-3 Fatty Acids (FISH OIL) 1000 MG CAPS Take by mouth.     No facility-administered medications prior to visit.    No Known Allergies  ROS Review of Systems    Objective:    Physical Exam  BP 130/77   Pulse 82   Ht 5\' 5"  (1.651 m)   Wt 185 lb (83.9 kg)   SpO2 98%   BMI 30.79 kg/m  Wt Readings from Last 3 Encounters:  10/06/19 185 lb (83.9 kg)  02/08/19 184 lb (83.5 kg)  10/13/18 193 lb (87.5 kg)     There are no preventive care reminders to display for this patient.  There are no preventive care reminders to display for this patient.  Lab Results  Component Value Date   TSH 1.59 11/20/2015   Lab Results  Component Value Date   WBC 9.4 10/13/2018   HGB 13.2 10/13/2018   HCT 41.6 10/13/2018   MCV 83.9 10/13/2018   PLT 368 10/13/2018    Lab Results  Component Value Date   NA 141 10/13/2018   K 4.4 10/13/2018   CO2 28 10/13/2018   GLUCOSE 97 10/13/2018   BUN 10 10/13/2018   CREATININE 0.83 10/13/2018   BILITOT 0.4 10/13/2018   ALKPHOS 79 11/20/2015   AST 16 10/13/2018   ALT 19 10/13/2018   PROT 6.4 10/13/2018   ALBUMIN 4.3 11/20/2015   CALCIUM 9.0 10/13/2018   Lab Results  Component Value Date   CHOL 264 (H) 10/13/2018   Lab Results  Component Value Date   HDL 48 (L) 10/13/2018   Lab Results  Component Value Date   LDLCALC 186 (H) 10/13/2018   Lab Results  Component Value Date   TRIG 153 (H) 10/13/2018   Lab Results  Component Value Date   CHOLHDL 5.5 (H) 10/13/2018   No results found for: HGBA1C    Assessment & Plan:   Problem List Items Addressed This Visit    None    Visit Diagnoses    Skin tag    -  Primary     Skin tags  -therapy performed.  Patient tolerated well.  Cryotherapy Procedure Note  Pre-operative Diagnosis: skin tags   Post-operative Diagnosis: same  Locations: see note above.  9 lesions treated.   Indications: irritation on bra strap and clothing.   Anesthesia: not required    Procedure Details  Patient informed of risks (permanent scarring, infection, light or dark discoloration, bleeding, infection, weakness, numbness and recurrence of the lesion) and benefits of the procedure and verbal informed consent obtained.  The areas are treated with liquid nitrogen therapy, frozen until ice ball extended 1-2 mm beyond lesion, allowed to thaw, and treated again. The patient tolerated procedure well.  The patient was instructed on post-op care, warned that there may be blister formation, redness and pain. Recommend OTC analgesia as needed for pain.  Condition: Stable  Complications: none.  Plan: 1. Instructed to keep the area dry and covered for 24-48h and clean thereafter. 2. Warning signs of infection were reviewed.   3. Recommended that the patient use OTC  acetaminophen as needed for pain.  4. Return pRN.    No orders of the defined types were placed in this encounter.   Follow-up: Return if symptoms worsen or fail to improve.  Beatrice Lecher, MD

## 2019-10-31 ENCOUNTER — Other Ambulatory Visit: Payer: Self-pay | Admitting: *Deleted

## 2019-10-31 ENCOUNTER — Encounter: Payer: Self-pay | Admitting: Family Medicine

## 2019-10-31 DIAGNOSIS — Z0000 Encounter for general adult medical examination without abnormal findings: Secondary | ICD-10-CM

## 2019-11-01 ENCOUNTER — Other Ambulatory Visit: Payer: Self-pay | Admitting: *Deleted

## 2019-11-01 ENCOUNTER — Encounter: Payer: Self-pay | Admitting: Family Medicine

## 2019-11-02 DIAGNOSIS — Z0000 Encounter for general adult medical examination without abnormal findings: Secondary | ICD-10-CM | POA: Diagnosis not present

## 2019-11-03 LAB — COMPLETE METABOLIC PANEL WITH GFR
AG Ratio: 1.9 (calc) (ref 1.0–2.5)
ALT: 16 U/L (ref 6–29)
AST: 13 U/L (ref 10–35)
Albumin: 4.2 g/dL (ref 3.6–5.1)
Alkaline phosphatase (APISO): 68 U/L (ref 37–153)
BUN: 11 mg/dL (ref 7–25)
CO2: 29 mmol/L (ref 20–32)
Calcium: 8.9 mg/dL (ref 8.6–10.4)
Chloride: 106 mmol/L (ref 98–110)
Creat: 0.9 mg/dL (ref 0.50–0.99)
GFR, Est African American: 81 mL/min/{1.73_m2} (ref >=60)
GFR, Est Non African American: 69 mL/min/{1.73_m2} (ref >=60)
Globulin: 2.2 g/dL (calc) (ref 1.9–3.7)
Glucose, Bld: 95 mg/dL (ref 65–99)
Potassium: 4.5 mmol/L (ref 3.5–5.3)
Sodium: 139 mmol/L (ref 135–146)
Total Bilirubin: 0.4 mg/dL (ref 0.2–1.2)
Total Protein: 6.4 g/dL (ref 6.1–8.1)

## 2019-11-03 LAB — CBC
HCT: 42.4 % (ref 35.0–45.0)
Hemoglobin: 14.4 g/dL (ref 11.7–15.5)
MCH: 28.7 pg (ref 27.0–33.0)
MCHC: 34 g/dL (ref 32.0–36.0)
MCV: 84.6 fL (ref 80.0–100.0)
MPV: 10 fL (ref 7.5–12.5)
Platelets: 387 10*3/uL (ref 140–400)
RBC: 5.01 10*6/uL (ref 3.80–5.10)
RDW: 14.3 % (ref 11.0–15.0)
WBC: 8.9 10*3/uL (ref 3.8–10.8)

## 2019-11-03 LAB — LIPID PANEL
Cholesterol: 263 mg/dL — ABNORMAL HIGH (ref <200)
HDL: 42 mg/dL — ABNORMAL LOW (ref >=50)
LDL Cholesterol (Calc): 179 mg/dL (calc) — ABNORMAL HIGH
Non-HDL Cholesterol (Calc): 221 mg/dL (calc) — ABNORMAL HIGH (ref <130)
Total CHOL/HDL Ratio: 6.3 (calc) — ABNORMAL HIGH (ref <5.0)
Triglycerides: 228 mg/dL — ABNORMAL HIGH (ref <150)

## 2019-11-03 LAB — TSH: TSH: 1.75 mIU/L (ref 0.40–4.50)

## 2019-11-07 ENCOUNTER — Encounter: Payer: Self-pay | Admitting: Family Medicine

## 2019-11-07 ENCOUNTER — Ambulatory Visit (INDEPENDENT_AMBULATORY_CARE_PROVIDER_SITE_OTHER): Payer: BC Managed Care – PPO | Admitting: Family Medicine

## 2019-11-07 ENCOUNTER — Other Ambulatory Visit: Payer: Self-pay

## 2019-11-07 VITALS — BP 115/85 | HR 77 | Ht 65.0 in | Wt 186.0 lb

## 2019-11-07 DIAGNOSIS — Z0000 Encounter for general adult medical examination without abnormal findings: Secondary | ICD-10-CM | POA: Diagnosis not present

## 2019-11-07 NOTE — Patient Instructions (Signed)
Health Maintenance, Female Adopting a healthy lifestyle and getting preventive care are important in promoting health and wellness. Ask your health care provider about:  The right schedule for you to have regular tests and exams.  Things you can do on your own to prevent diseases and keep yourself healthy. What should I know about diet, weight, and exercise? Eat a healthy diet   Eat a diet that includes plenty of vegetables, fruits, low-fat dairy products, and lean protein.  Do not eat a lot of foods that are high in solid fats, added sugars, or sodium. Maintain a healthy weight Body mass index (BMI) is used to identify weight problems. It estimates body fat based on height and weight. Your health care provider can help determine your BMI and help you achieve or maintain a healthy weight. Get regular exercise Get regular exercise. This is one of the most important things you can do for your health. Most adults should:  Exercise for at least 150 minutes each week. The exercise should increase your heart rate and make you sweat (moderate-intensity exercise).  Do strengthening exercises at least twice a week. This is in addition to the moderate-intensity exercise.  Spend less time sitting. Even light physical activity can be beneficial. Watch cholesterol and blood lipids Have your blood tested for lipids and cholesterol at 60 years of age, then have this test every 5 years. Have your cholesterol levels checked more often if:  Your lipid or cholesterol levels are high.  You are older than 60 years of age.  You are at high risk for heart disease. What should I know about cancer screening? Depending on your health history and family history, you may need to have cancer screening at various ages. This may include screening for:  Breast cancer.  Cervical cancer.  Colorectal cancer.  Skin cancer.  Lung cancer. What should I know about heart disease, diabetes, and high blood  pressure? Blood pressure and heart disease  High blood pressure causes heart disease and increases the risk of stroke. This is more likely to develop in people who have high blood pressure readings, are of African descent, or are overweight.  Have your blood pressure checked: ? Every 3-5 years if you are 18-39 years of age. ? Every year if you are 40 years old or older. Diabetes Have regular diabetes screenings. This checks your fasting blood sugar level. Have the screening done:  Once every three years after age 40 if you are at a normal weight and have a low risk for diabetes.  More often and at a younger age if you are overweight or have a high risk for diabetes. What should I know about preventing infection? Hepatitis B If you have a higher risk for hepatitis B, you should be screened for this virus. Talk with your health care provider to find out if you are at risk for hepatitis B infection. Hepatitis C Testing is recommended for:  Everyone born from 1945 through 1965.  Anyone with known risk factors for hepatitis C. Sexually transmitted infections (STIs)  Get screened for STIs, including gonorrhea and chlamydia, if: ? You are sexually active and are younger than 60 years of age. ? You are older than 60 years of age and your health care provider tells you that you are at risk for this type of infection. ? Your sexual activity has changed since you were last screened, and you are at increased risk for chlamydia or gonorrhea. Ask your health care provider if   you are at risk.  Ask your health care provider about whether you are at high risk for HIV. Your health care provider may recommend a prescription medicine to help prevent HIV infection. If you choose to take medicine to prevent HIV, you should first get tested for HIV. You should then be tested every 3 months for as long as you are taking the medicine. Pregnancy  If you are about to stop having your period (premenopausal) and  you may become pregnant, seek counseling before you get pregnant.  Take 400 to 800 micrograms (mcg) of folic acid every day if you become pregnant.  Ask for birth control (contraception) if you want to prevent pregnancy. Osteoporosis and menopause Osteoporosis is a disease in which the bones lose minerals and strength with aging. This can result in bone fractures. If you are 65 years old or older, or if you are at risk for osteoporosis and fractures, ask your health care provider if you should:  Be screened for bone loss.  Take a calcium or vitamin D supplement to lower your risk of fractures.  Be given hormone replacement therapy (HRT) to treat symptoms of menopause. Follow these instructions at home: Lifestyle  Do not use any products that contain nicotine or tobacco, such as cigarettes, e-cigarettes, and chewing tobacco. If you need help quitting, ask your health care provider.  Do not use street drugs.  Do not share needles.  Ask your health care provider for help if you need support or information about quitting drugs. Alcohol use  Do not drink alcohol if: ? Your health care provider tells you not to drink. ? You are pregnant, may be pregnant, or are planning to become pregnant.  If you drink alcohol: ? Limit how much you use to 0-1 drink a day. ? Limit intake if you are breastfeeding.  Be aware of how much alcohol is in your drink. In the [place] one drink equals one 12 oz bottle of beer (355 mL), one 5 oz glass of wine (148 mL), or one 1 oz glass of hard liquor (44 mL). General instructions  Schedule regular health, dental, and eye exams.  Stay current with your vaccines.  Tell your health care provider if: ? You often feel depressed. ? You have ever been abused or do not feel safe at home. Summary  Adopting a healthy lifestyle and getting preventive care are important in promoting health and wellness.  Follow your health care provider's instructions about healthy  diet, exercising, and getting tested or screened for diseases.  Follow your health care provider's instructions on monitoring your cholesterol and blood pressure. This information is not intended to replace advice given to you by your health care provider. Make sure you discuss any questions you have with your health care provider. Document Revised: 01/20/2018 Document Reviewed: 01/20/2018 Elsevier Patient Education  2020 [employer]  

## 2019-11-07 NOTE — Progress Notes (Signed)
Subjective:     Gina Macias is a 59 y.o. female and is here for a comprehensive physical exam. The patient reports no problems. She is well. No regular exercise.  Planning on getting her flu shot at work.  To go over her blood work today.  Social History   Socioeconomic History  . Marital status: Married    Spouse name: Molly Maduro   . Number of children: 3  . Years of education: College  . Highest education level: Not on file  Occupational History  . Occupation: Agricultural engineer: [employer]  Tobacco Use  . Smoking status: Former Smoker    Packs/day: 1.00    Types: Cigarettes    Quit date: 02/13/2015    Years since quitting: 4.7  . Smokeless tobacco: Never Used  Substance and Sexual Activity  . Alcohol use: Yes    Alcohol/week: 1.0 - 2.0 standard drink    Types: 1 - 2 Cans of beer per week  . Drug use: No  . Sexual activity: Yes    Partners: Male    Birth control/protection: None  Other Topics Concern  . Not on file  Social History Narrative   Her GYN is Dr. Unknown Foley at [facility]. She has 2 daughters that still live at home. One daughter is married and out of the home. She does exercise daily for about 20-30 minutes with walking and doing weights. 2 caffeinated drinks daily.   Social Determinants of Health   Financial Resource Strain:   . Difficulty of Paying Living Expenses: Not on file  Food Insecurity:   . Worried About Programme researcher, broadcasting/film/video in the Last Year: Not on file  . Ran Out of Food in the Last Year: Not on file  Transportation Needs:   . Lack of Transportation (Medical): Not on file  . Lack of Transportation (Non-Medical): Not on file  Physical Activity:   . Days of Exercise per Week: Not on file  . Minutes of Exercise per Session: Not on file  Stress:   . Feeling of Stress : Not on file  Social Connections:   . Frequency of Communication with Friends and Family: Not on file  . Frequency of Social Gatherings with Friends and  Family: Not on file  . Attends Religious Services: Not on file  . Active Member of Clubs or Organizations: Not on file  . Attends Banker Meetings: Not on file  . Marital Status: Not on file  Intimate Partner Violence:   . Fear of Current or Ex-Partner: Not on file  . Emotionally Abused: Not on file  . Physically Abused: Not on file  . Sexually Abused: Not on file   Health Maintenance  Topic Date Due  . INFLUENZA VACCINE  05/10/2020 (Originally 09/11/2019)  . COVID-19 Vaccine (1) 10/05/2020 (Originally 07/01/1972)  . Hepatitis C Screening  11/06/2020 (Originally 25-Dec-1960)  . HIV Screening  11/06/2020 (Originally [birth year]01[birth year]1974)  . PAP SMEAR-Modifier  03/16/2020  . TETANUS/TDAP  10/08/2020  . COLONOSCOPY  11/27/2020  . MAMMOGRAM  04/28/2021    The following portions of the patient's history were reviewed and updated as appropriate: allergies, current medications, past family history, past medical history, past social history, past surgical history and problem list.  Review of Systems A comprehensive review of systems was negative.   Objective:    BP 115/85   Pulse 77   Ht 5\' 5"  (1.651 m)   Wt 186 lb (84.4 kg)  SpO2 100%   BMI 30.95 kg/m  General appearance: alert, cooperative and appears stated age Head: Normocephalic, without obvious abnormality, atraumatic Eyes: con clear, EOMI, PEERLA Ears: normal TM's and external ear canals both ears Nose: Nares normal. Septum midline. Mucosa normal. No drainage or sinus tenderness. Throat: lips, mucosa, and tongue normal; teeth and gums normal Neck: no adenopathy, no carotid bruit, no JVD, supple, symmetrical, trachea midline and thyroid not enlarged, symmetric, no tenderness/mass/nodules Back: symmetric, no curvature. ROM normal. No CVA tenderness. Lungs: clear to auscultation bilaterally Heart: regular rate and rhythm, S1, S2 normal, no murmur, click, rub or gallop Abdomen: soft, non-tender; bowel sounds normal; no masses,   no organomegaly Extremities: extremities normal, atraumatic, no cyanosis or edema Pulses: 2+ and symmetric Skin: Skin color, texture, turgor normal. No rashes or lesions Lymph nodes: Cervical, supraclavicular, and axillary nodes normal. Neurologic: Alert and oriented X 3, normal strength and tone. Normal symmetric reflexes. Normal coordination and gait    Assessment:    Healthy female exam.      Plan:     See After Visit Summary for Counseling Recommendations   Keep up a regular exercise program and make sure you are eating a healthy diet Try to eat 4 servings of dairy a day, or if you are lactose intolerant take a calcium with vitamin D daily.  Your vaccines are up to date.  The recent lab results with her and her cardiovascular risk based on her elevated lipid levels.

## 2019-11-22 ENCOUNTER — Encounter: Payer: Self-pay | Admitting: Family Medicine

## 2020-02-08 DIAGNOSIS — Z01419 Encounter for gynecological examination (general) (routine) without abnormal findings: Secondary | ICD-10-CM | POA: Diagnosis not present

## 2020-02-08 DIAGNOSIS — Z6830 Body mass index (BMI) 30.0-30.9, adult: Secondary | ICD-10-CM | POA: Diagnosis not present

## 2020-02-08 DIAGNOSIS — Z13820 Encounter for screening for osteoporosis: Secondary | ICD-10-CM | POA: Diagnosis not present

## 2020-03-08 ENCOUNTER — Encounter: Payer: Self-pay | Admitting: Family Medicine

## 2020-04-23 LAB — HM MAMMOGRAPHY

## 2020-04-26 ENCOUNTER — Encounter: Payer: Self-pay | Admitting: Family Medicine
